# Patient Record
Sex: Male | Born: 1995 | Race: White | Hispanic: No | Marital: Single | Smoking: Current every day smoker
Health system: Southern US, Community
[De-identification: ages and names within clinical notes are randomized; demographics above are authoritative.]

## PROBLEM LIST (undated history)

## (undated) DIAGNOSIS — F419 Anxiety disorder, unspecified: Secondary | ICD-10-CM

## (undated) DIAGNOSIS — F410 Panic disorder [episodic paroxysmal anxiety] without agoraphobia: Secondary | ICD-10-CM

## (undated) HISTORY — DX: Anxiety disorder, unspecified: F41.9

## (undated) HISTORY — DX: Panic disorder (episodic paroxysmal anxiety): F41.0

---
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
## 2017-04-16 ENCOUNTER — Ambulatory Visit: Payer: Medicaid Other | Admitting: Family Medicine

## 2017-04-19 ENCOUNTER — Encounter: Payer: Self-pay | Admitting: Family Medicine

## 2017-08-31 ENCOUNTER — Emergency Department (HOSPITAL_COMMUNITY)
Admission: EM | Admit: 2017-08-31 | Discharge: 2017-08-31 | Disposition: A | Discharge status: Home / Self Care | Payer: Self-pay | Attending: Emergency Medicine | Admitting: Emergency Medicine

## 2017-08-31 ENCOUNTER — Other Ambulatory Visit: Payer: Self-pay

## 2017-08-31 ENCOUNTER — Encounter (HOSPITAL_COMMUNITY): Payer: Self-pay | Admitting: *Deleted

## 2017-08-31 DIAGNOSIS — Y9302 Activity, running: Secondary | ICD-10-CM | POA: Insufficient documentation

## 2017-08-31 DIAGNOSIS — Y998 Other external cause status: Secondary | ICD-10-CM | POA: Insufficient documentation

## 2017-08-31 DIAGNOSIS — Y929 Unspecified place or not applicable: Secondary | ICD-10-CM | POA: Insufficient documentation

## 2017-08-31 DIAGNOSIS — F17200 Nicotine dependence, unspecified, uncomplicated: Secondary | ICD-10-CM | POA: Insufficient documentation

## 2017-08-31 DIAGNOSIS — S39012A Strain of muscle, fascia and tendon of lower back, initial encounter: Secondary | ICD-10-CM | POA: Insufficient documentation

## 2017-08-31 DIAGNOSIS — Z79899 Other long term (current) drug therapy: Secondary | ICD-10-CM | POA: Insufficient documentation

## 2017-08-31 DIAGNOSIS — Y33XXXA Other specified events, undetermined intent, initial encounter: Secondary | ICD-10-CM | POA: Insufficient documentation

## 2017-08-31 MED ORDER — CYCLOBENZAPRINE HCL 10 MG PO TABS: 10.0000 mg | ORAL_TABLET | Freq: Three times a day (TID) | ORAL | 0 refills | Status: AC

## 2017-08-31 MED ORDER — IBUPROFEN 600 MG PO TABS: 600.0000 mg | ORAL_TABLET | Freq: Four times a day (QID) | ORAL | 0 refills | Status: DC

## 2017-08-31 NOTE — ED Triage Notes (Signed)
Pt c/o right lower back pain yesterday after running around playing with his dog.

## 2017-08-31 NOTE — Discharge Instructions (Signed)
Your vital signs are within normal limits.  Your examination favors muscle strain involving the lower back.  Please apply heating pad, or use warm Epson salt soaks daily.  Use 600 mg of ibuprofen with breakfast, lunch, dinner, and at bedtime.  Use Flexeril 3 times daily.  Flexeril may cause drowsiness.  Please do not drive, drink, operate machinery, or participate in activities requiring concentration when taking this medication.  Please see Dr. Romeo AppleHarrison for orthopedic management if not improving.

## 2017-08-31 NOTE — ED Provider Notes (Signed)
[facility] EMERGENCY DEPARTMENT Provider Note   CSN: [id number] Arrival date & time: 08/31/17  1458     History   Chief Complaint Chief Complaint  Patient presents with  . Back Pain    HPI Brett Salazar is a 21 y.o. male.  Patient is a 21 year old male who presents to the emergency department with a complaint of back pain.  The patient states that yesterday while running and chasing his dog he first felt a pull in his back.  He continued to play with his dog and later had a more sharp pain.  Last night and today he states it hurts when he tries to get out of the bed or sit down.  No loss of bowel or bladder function.  No loss of control of the lower extremity.  No previous history of back related issues.  The patient has not taken any medication for this up to this point.   The history is provided by the patient.  Back Pain   Pertinent negatives include no chest pain, no abdominal pain and no dysuria.    Past Medical History:  Diagnosis Date  . Anxiety   . Panic disorder     There are no active problems to display for this patient.   History reviewed. No pertinent surgical history.     Home Medications    Prior to Admission medications   Medication Sig Start Date End Date Taking? Authorizing Provider  clonazePAM (KLONOPIN) 0.5 MG tablet Take 1 tablet (0.5 mg total) by mouth 2 (two) times daily as needed for anxiety. 01/29/14   Floydene FlockNewton, Steven J, MD  FLUoxetine (PROZAC) 20 MG tablet Start with 1/2 tab daily for 1 week, then 1 tab daily thereafter 01/29/14   Floydene FlockNewton, Steven J, MD    Family History No family history on file.  Social History Social History   Tobacco Use  . Smoking status: Current Every Day Smoker    Packs/day: 1.00    Years: 3.00    Pack years: 3.00  . Smokeless tobacco: Never Used  Substance Use Topics  . Alcohol use: No  . Drug use: No     Allergies   Patient has no known allergies.   Review of Systems Review of Systems    Constitutional: Negative for activity change.       All ROS Neg except as noted in HPI  HENT: Negative for nosebleeds.   Eyes: Negative for photophobia and discharge.  Respiratory: Negative for cough, shortness of breath and wheezing.   Cardiovascular: Negative for chest pain and palpitations.  Gastrointestinal: Negative for abdominal pain and blood in stool.  Genitourinary: Negative for dysuria, frequency and hematuria.  Musculoskeletal: Positive for back pain. Negative for arthralgias and neck pain.  Skin: Negative.   Neurological: Negative for dizziness, seizures and speech difficulty.  Psychiatric/Behavioral: Negative for confusion and hallucinations.     Physical Exam Updated Vital Signs BP 119/84 (BP Location: Right Arm)   Pulse 99   Temp 98.5 F (36.9 C) (Oral)   Resp 18   Ht 5\' 8"  (1.727 m)   Wt 72.6 kg (160 lb)   SpO2 100%   BMI 24.33 kg/m   Physical Exam  Constitutional: He is oriented to person, place, and time. He appears well-developed and well-nourished.  Non-toxic appearance.  HENT:  Head: Normocephalic.  Right Ear: Tympanic membrane and external ear normal.  Left Ear: Tympanic membrane and external ear normal.  Eyes: EOM and lids are normal. Pupils are  equal, round, and reactive to light.  Neck: Normal range of motion. Neck supple. Carotid bruit is not present.  Cardiovascular: Normal rate, regular rhythm, normal heart sounds, intact distal pulses and normal pulses.  Pulmonary/Chest: Breath sounds normal. No respiratory distress.  Abdominal: Soft. Bowel sounds are normal. There is no tenderness. There is no guarding.  Musculoskeletal: Normal range of motion.       Lumbar back: He exhibits pain and spasm.       Back:  Lymphadenopathy:       Head (right side): No submandibular adenopathy present.       Head (left side): No submandibular adenopathy present.    He has no cervical adenopathy.  Neurological: He is alert and oriented to person, place, and  time. He has normal strength. No cranial nerve deficit or sensory deficit.  Skin: Skin is warm and dry.  Psychiatric: He has a normal mood and affect. His speech is normal.  Nursing note and vitals reviewed.    ED Treatments / Results  Labs (all labs ordered are listed, but only abnormal results are displayed) Labs Reviewed - No data to display  EKG  EKG Interpretation None       Radiology No results found.  Procedures Procedures (including critical care time)  Medications Ordered in ED Medications - No data to display   Initial Impression / Assessment and Plan / ED Course  I have reviewed the triage vital signs and the nursing notes.  Pertinent labs & imaging results that were available during my care of the patient were reviewed by me and considered in my medical decision making (see chart for details).       Final Clinical Impressions(s) / ED Diagnoses MDM Vital signs within normal limits.  There are no gross neurologic or vascular deficits appreciated.  No evidence for cauda equina or other emergent changes.  The patient is ambulatory without any foot drop.  Patient will use heating pad and rest the back.  He will also use ibuprofen every 6 hours and Flexeril 3 times daily.  The patient will follow up with orthopedics if not improving.   Final diagnoses:  Strain of lumbar region, initial encounter    ED Discharge Orders        Ordered    ibuprofen (ADVIL,MOTRIN) 600 MG tablet  4 times daily     08/31/17 1639    cyclobenzaprine (FLEXERIL) 10 MG tablet  3 times daily     08/31/17 1639       Ivery QualeBryant, Lizann Edelman, PA-C 08/31/17 1655    Long, Arlyss RepressJoshua G, MD 09/01/17 1017

## 2017-11-10 ENCOUNTER — Other Ambulatory Visit: Payer: Self-pay

## 2017-11-10 ENCOUNTER — Encounter (HOSPITAL_COMMUNITY): Payer: Self-pay

## 2017-11-10 ENCOUNTER — Emergency Department (HOSPITAL_COMMUNITY)
Admission: EM | Admit: 2017-11-10 | Discharge: 2017-11-11 | Disposition: A | Discharge status: Home / Self Care | Payer: Self-pay | Attending: Emergency Medicine | Admitting: Emergency Medicine

## 2017-11-10 ENCOUNTER — Emergency Department (HOSPITAL_COMMUNITY): Payer: Self-pay

## 2017-11-10 DIAGNOSIS — J40 Bronchitis, not specified as acute or chronic: Secondary | ICD-10-CM | POA: Insufficient documentation

## 2017-11-10 DIAGNOSIS — Z79899 Other long term (current) drug therapy: Secondary | ICD-10-CM | POA: Insufficient documentation

## 2017-11-10 DIAGNOSIS — F17200 Nicotine dependence, unspecified, uncomplicated: Secondary | ICD-10-CM | POA: Insufficient documentation

## 2017-11-10 DIAGNOSIS — M25512 Pain in left shoulder: Secondary | ICD-10-CM | POA: Insufficient documentation
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
## 2017-11-11 MED ORDER — LIDOCAINE 5 % EX PTCH: 1.0000 | MEDICATED_PATCH | CUTANEOUS | 0 refills | Status: AC

## 2017-11-11 MED ORDER — BENZONATATE 100 MG PO CAPS: 100.0000 mg | ORAL_CAPSULE | Freq: Three times a day (TID) | ORAL | 0 refills | Status: AC

## 2017-11-11 MED ORDER — PREDNISONE 10 MG PO TABS: 40.0000 mg | ORAL_TABLET | Freq: Every day | ORAL | 0 refills | Status: AC

## 2017-11-11 MED ORDER — PROMETHAZINE-DM 6.25-15 MG/5ML PO SYRP: 5.0000 mL | ORAL_SOLUTION | Freq: Four times a day (QID) | ORAL | 0 refills | Status: AC | PRN

## 2017-11-11 NOTE — ED Notes (Signed)
ED Provider at bedside. 
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
## 2017-11-11 NOTE — Discharge Instructions (Signed)
Take ibuprofen 3 times a day with meals.  Do not take other anti-inflammatories at the same time open (Advil, Motrin, naproxen, Aleve). You may supplement with Tylenol if you need further pain control. Use ice packs or heating pads if this helps control your pain. Take prednisone as prescribed. Use Tessalon Perles and cough syrup as needed. Make sure you stay well-hydrated with water. Wash your hands frequently to prevent spread of infection. Use the lidocaine patches as needed for pain. Follow-up with orthopedic doctor for further evaluation and management of your shoulder pain. Return to the emergency room if you develop numbness, worsening pain, difficulty breathing, persistent chest pain, persistent high fevers despite medication, or any new or worsening symptoms.

## 2017-12-02 ENCOUNTER — Encounter (HOSPITAL_BASED_OUTPATIENT_CLINIC_OR_DEPARTMENT_OTHER): Payer: Self-pay | Admitting: Emergency Medicine

## 2017-12-02 ENCOUNTER — Other Ambulatory Visit: Payer: Self-pay

## 2017-12-02 ENCOUNTER — Emergency Department (HOSPITAL_BASED_OUTPATIENT_CLINIC_OR_DEPARTMENT_OTHER)
Admission: EM | Admit: 2017-12-02 | Discharge: 2017-12-02 | Disposition: A | Discharge status: Home / Self Care | Payer: Self-pay | Attending: Emergency Medicine | Admitting: Emergency Medicine

## 2017-12-02 DIAGNOSIS — F17210 Nicotine dependence, cigarettes, uncomplicated: Secondary | ICD-10-CM | POA: Insufficient documentation

## 2017-12-02 DIAGNOSIS — R300 Dysuria: Secondary | ICD-10-CM | POA: Insufficient documentation

## 2017-12-02 DIAGNOSIS — R309 Painful micturition, unspecified: Secondary | ICD-10-CM | POA: Insufficient documentation

## 2017-12-02 DIAGNOSIS — Z202 Contact with and (suspected) exposure to infections with a predominantly sexual mode of transmission: Secondary | ICD-10-CM | POA: Insufficient documentation

## 2017-12-02 DIAGNOSIS — Z79899 Other long term (current) drug therapy: Secondary | ICD-10-CM | POA: Insufficient documentation

## 2017-12-02 LAB — URINALYSIS, ROUTINE W REFLEX MICROSCOPIC
Bilirubin Urine: NEGATIVE
Glucose, UA: NEGATIVE mg/dL
Hgb urine dipstick: NEGATIVE
KETONES UR: NEGATIVE mg/dL
NITRITE: NEGATIVE
PROTEIN: NEGATIVE mg/dL
Specific Gravity, Urine: 1.02 (ref 1.005–1.030)
pH: 7 (ref 5.0–8.0)

## 2017-12-02 LAB — URINALYSIS, MICROSCOPIC (REFLEX)

## 2017-12-02 MED ORDER — CEFTRIAXONE SODIUM 250 MG IJ SOLR
250.0000 mg | Freq: Once | INTRAMUSCULAR | Status: AC
  Administered 2017-12-02: 250 mg via INTRAMUSCULAR
  Filled 2017-12-02: qty 250

## 2017-12-02 MED ORDER — AZITHROMYCIN 250 MG PO TABS
1000.0000 mg | ORAL_TABLET | Freq: Once | ORAL | Status: AC
  Administered 2017-12-02: 1000 mg via ORAL
  Filled 2017-12-02: qty 4

## 2017-12-02 MED ORDER — LIDOCAINE HCL (PF) 1 % IJ SOLN
INTRAMUSCULAR | Status: AC
  Administered 2017-12-02: 0.9 mL
  Filled 2017-12-02: qty 5

## 2017-12-02 NOTE — ED Provider Notes (Signed)
[facility] EMERGENCY DEPARTMENT Provider Note   CSN: [id number] Arrival date & time: 12/02/17  1414     History   Chief Complaint Chief Complaint  Patient presents with  . Exposure to STD    HPI Brett Salazar is a 22 y.o. male.  HPI   Brett Salazar is a 22yo male with a history of anxiety who presents to the emergency department for evaluation of penile discharge.  He reports that he has one male sexual partner who was recently diagnosed with chlamydia.  He does not regularly wear condoms with this partner.  States he noticed white discharge expressed from his penis about a week ago.  Endorses mild burning sensation with urination.  Denies fever, chills, abdominal pain, nausea/vomiting, urinary frequency, testicular pain/swelling, penile pain, painful bowel movements, lesion or rash on the genitalia.   Past Medical History:  Diagnosis Date  . Anxiety   . Panic disorder     There are no active problems to display for this patient.   History reviewed. No pertinent surgical history.     Home Medications    Prior to Admission medications   Medication Sig Start Date End Date Taking? Authorizing Provider  benzonatate (TESSALON) 100 MG capsule Take 1 capsule (100 mg total) by mouth every 8 (eight) hours. 11/11/17   Caccavale, Sophia, PA-C  clonazePAM (KLONOPIN) 0.5 MG tablet Take 1 tablet (0.5 mg total) by mouth 2 (two) times daily as needed for anxiety. 01/29/14   Floydene Flock, MD  cyclobenzaprine (FLEXERIL) 10 MG tablet Take 1 tablet (10 mg total) by mouth 3 (three) times daily. 08/31/17   Ivery Quale, PA-C  FLUoxetine (PROZAC) 20 MG tablet Start with 1/2 tab daily for 1 week, then 1 tab daily thereafter 01/29/14   Floydene Flock, MD  ibuprofen (ADVIL,MOTRIN) 600 MG tablet Take 1 tablet (600 mg total) by mouth 4 (four) times daily. 08/31/17   Ivery Quale, PA-C  lidocaine (LIDODERM) 5 % Place 1 patch onto the skin daily. Remove & Discard patch within 12 hours  or as directed by MD 11/11/17   Caccavale, Sophia, PA-C  promethazine-dextromethorphan (PROMETHAZINE-DM) 6.25-15 MG/5ML syrup Take 5 mLs by mouth 4 (four) times daily as needed for cough. 11/11/17   Caccavale, Sophia, PA-C    Family History History reviewed. No pertinent family history.  Social History Social History   Tobacco Use  . Smoking status: Current Every Day Smoker    Packs/day: 1.00    Years: 3.00    Pack years: 3.00  . Smokeless tobacco: Never Used  Substance Use Topics  . Alcohol use: No  . Drug use: No     Allergies   Patient has no known allergies.   Review of Systems Review of Systems  Constitutional: Negative for chills and fever.  Gastrointestinal: Negative for abdominal pain, nausea and vomiting.  Genitourinary: Positive for discharge and dysuria. Negative for difficulty urinating, frequency, hematuria, scrotal swelling and testicular pain.  Skin: Negative for rash.     Physical Exam Updated Vital Signs BP 111/88 (BP Location: Left Arm)   Pulse 75   Temp 98.5 F (36.9 C) (Oral)   Resp 18   Ht 5\' 8"  (1.727 m)   Wt 72.6 kg (160 lb)   SpO2 100%   BMI 24.33 kg/m   Physical Exam  Constitutional: He is oriented to person, place, and time. He appears well-developed and well-nourished. No distress.  HENT:  Head: Normocephalic and atraumatic.  Eyes: Right eye exhibits  no discharge. Left eye exhibits no discharge.  Pulmonary/Chest: Effort normal. No respiratory distress.  Abdominal: Soft. Bowel sounds are normal. There is no tenderness. There is no guarding.  Genitourinary:  Genitourinary Comments: Chaperone present for exam. No discharge from penis. No signs of lesion or erythema on the penis or testicles. The penis and testicles are nontender. No testicular masses or swelling.   Musculoskeletal: Normal range of motion.  Neurological: He is alert and oriented to person, place, and time. Coordination normal.  Skin: Skin is warm and dry. Capillary  refill takes less than 2 seconds. He is not diaphoretic.  Psychiatric: He has a normal mood and affect. His behavior is normal.  Nursing note and vitals reviewed.    ED Treatments / Results  Labs (all labs ordered are listed, but only abnormal results are displayed) Labs Reviewed  URINALYSIS, ROUTINE W REFLEX MICROSCOPIC - Abnormal; Notable for the following components:      Result Value   Leukocytes, UA TRACE (*)    All other components within normal limits  URINALYSIS, MICROSCOPIC (REFLEX) - Abnormal; Notable for the following components:   Bacteria, UA RARE (*)    Squamous Epithelial / LPF 0-5 (*)    All other components within normal limits  RPR  HIV ANTIBODY (ROUTINE TESTING)  GC/CHLAMYDIA PROBE AMP ([place]) NOT AT [facility]    EKG  EKG Interpretation None       Radiology No results found.  Procedures Procedures (including critical care time)  Medications Ordered in ED Medications  azithromycin (ZITHROMAX) tablet 1,000 mg (1,000 mg Oral Given 12/02/17 1627)  cefTRIAXone (ROCEPHIN) injection 250 mg (250 mg Intramuscular Given 12/02/17 1627)  lidocaine (PF) (XYLOCAINE) 1 % injection (0.9 mLs  Given 12/02/17 1627)     Initial Impression / Assessment and Plan / ED Course  I have reviewed the triage vital signs and the nursing notes.  Pertinent labs & imaging results that were available during my care of the patient were reviewed by me and considered in my medical decision making (see chart for details).    Patient is afebrile without abdominal tenderness, abdominal pain or painful bowel movements to indicate prostatitis.  No tenderness to palpation of the testes or epididymis to suggest orchitis or epididymitis. STD cultures obtained including HIV, syphilis, gonorrhea and chlamydia. Patient to be discharged with instructions to follow up with PCP. Discussed importance of using protection when sexually active. Pt understands that he has GC/Chlamydia cultures pending and  will need to inform all sexual partners if results return positive. Patient has been treated prophylactically with azithromycin and Rocephin.  Final Clinical Impressions(s) / ED Diagnoses   Final diagnoses:  STD exposure    ED Discharge Orders    None       Lawrence MarseillesShrosbree, Daphna Lafuente J, PA-C 12/02/17 1640    Alvira MondaySchlossman, Erin, MD 12/02/17 2324

## 2017-12-02 NOTE — ED Triage Notes (Signed)
Patient states that he may have been exposed to an STD - possible chlamydia. The patient reports that he is having a milky d/c

## 2017-12-02 NOTE — Discharge Instructions (Signed)
You were treated for chlamydia and area in the emergency department.  Please refrain from sexual activity for the next 7 days.  You have HIV, syphilis, gonorrhea and Chlamydia testing which is been sent to the lab.  He will receive a phone call in the next several days if your results are positive.  If positive you will need to inform all sexual partners.  I have listed the information below to the health department should you have future STD concerns.  Return to the emergency department if you develop fever greater than 100.4 F, testicular pain or have any new or worsening symptoms.

## 2017-12-03 LAB — HIV ANTIBODY (ROUTINE TESTING W REFLEX): HIV Screen 4th Generation wRfx: NONREACTIVE

## 2017-12-03 LAB — GC/CHLAMYDIA PROBE AMP (~~LOC~~) NOT AT ARMC
Chlamydia: POSITIVE — AB
Neisseria Gonorrhea: NEGATIVE

## 2017-12-03 LAB — RPR: RPR Ser Ql: NONREACTIVE
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
## 2019-03-17 ENCOUNTER — Encounter (HOSPITAL_COMMUNITY): Payer: Self-pay | Admitting: Emergency Medicine

## 2019-03-17 ENCOUNTER — Emergency Department (HOSPITAL_COMMUNITY)
Admission: EM | Admit: 2019-03-17 | Discharge: 2019-03-17 | Disposition: A | Discharge status: Home / Self Care | Payer: Self-pay | Attending: Emergency Medicine | Admitting: Emergency Medicine

## 2019-03-17 ENCOUNTER — Other Ambulatory Visit: Payer: Self-pay

## 2019-03-17 DIAGNOSIS — Y9389 Activity, other specified: Secondary | ICD-10-CM | POA: Insufficient documentation

## 2019-03-17 DIAGNOSIS — F17210 Nicotine dependence, cigarettes, uncomplicated: Secondary | ICD-10-CM | POA: Insufficient documentation

## 2019-03-17 DIAGNOSIS — Y929 Unspecified place or not applicable: Secondary | ICD-10-CM | POA: Insufficient documentation

## 2019-03-17 DIAGNOSIS — S80862A Insect bite (nonvenomous), left lower leg, initial encounter: Secondary | ICD-10-CM | POA: Insufficient documentation

## 2019-03-17 DIAGNOSIS — S80861A Insect bite (nonvenomous), right lower leg, initial encounter: Secondary | ICD-10-CM | POA: Insufficient documentation

## 2019-03-17 DIAGNOSIS — Z79899 Other long term (current) drug therapy: Secondary | ICD-10-CM | POA: Insufficient documentation

## 2019-03-17 DIAGNOSIS — L539 Erythematous condition, unspecified: Secondary | ICD-10-CM | POA: Insufficient documentation

## 2019-03-17 DIAGNOSIS — Y998 Other external cause status: Secondary | ICD-10-CM | POA: Insufficient documentation

## 2019-03-17 DIAGNOSIS — W57XXXA Bitten or stung by nonvenomous insect and other nonvenomous arthropods, initial encounter: Secondary | ICD-10-CM | POA: Insufficient documentation

## 2019-03-17 MED ORDER — DOXYCYCLINE HYCLATE 100 MG PO CAPS: 100.0000 mg | ORAL_CAPSULE | Freq: Two times a day (BID) | ORAL | 0 refills | Status: DC

## 2019-03-17 NOTE — ED Provider Notes (Signed)
[facility]ERGENCY DEPARTMENT Provider Note   CSN: [id number] Arrival date & time: 03/17/19  2155    History   Chief Complaint Chief Complaint  Patient presents with  . Insect Bite    tick bite    HPI Laury DeepJeffery D Brimley is a 23 y.o. male.     Patient is a 23 year old male presenting with multiple tick bites to both legs.  He states he is removed several ticks from each leg and has welts and red areas there.  He states he has not felt well, but denies fever or headache.  He denies other complaints.  The history is provided by the patient.    Past Medical History:  Diagnosis Date  . Anxiety   . Panic disorder     There are no active problems to display for this patient.   No past surgical history on file.      Home Medications    Prior to Admission medications   Medication Sig Start Date End Date Taking? Authorizing Provider  benzonatate (TESSALON) 100 MG capsule Take 1 capsule (100 mg total) by mouth every 8 (eight) hours. 11/11/17   Caccavale, Sophia, PA-C  clonazePAM (KLONOPIN) 0.5 MG tablet Take 1 tablet (0.5 mg total) by mouth 2 (two) times daily as needed for anxiety. 01/29/14   Floydene FlockNewton, Steven J, MD  cyclobenzaprine (FLEXERIL) 10 MG tablet Take 1 tablet (10 mg total) by mouth 3 (three) times daily. 08/31/17   Ivery QualeBryant, Hobson, PA-C  FLUoxetine (PROZAC) 20 MG tablet Start with 1/2 tab daily for 1 week, then 1 tab daily thereafter 01/29/14   Floydene FlockNewton, Steven J, MD  ibuprofen (ADVIL,MOTRIN) 600 MG tablet Take 1 tablet (600 mg total) by mouth 4 (four) times daily. 08/31/17   Ivery QualeBryant, Hobson, PA-C  lidocaine (LIDODERM) 5 % Place 1 patch onto the skin daily. Remove & Discard patch within 12 hours or as directed by MD 11/11/17   Caccavale, Sophia, PA-C  promethazine-dextromethorphan (PROMETHAZINE-DM) 6.25-15 MG/5ML syrup Take 5 mLs by mouth 4 (four) times daily as needed for cough. 11/11/17   Caccavale, Sophia, PA-C    Family History History reviewed. No pertinent family  history.  Social History Social History   Tobacco Use  . Smoking status: Current Every Day Smoker    Packs/day: 1.00    Years: 3.00    Pack years: 3.00  . Smokeless tobacco: Never Used  Substance Use Topics  . Alcohol use: No  . Drug use: No     Allergies   Patient has no known allergies.   Review of Systems Review of Systems  All other systems reviewed and are negative.    Physical Exam Updated Vital Signs BP 127/78 (BP Location: Right Arm)   Pulse 88   Temp 97.8 F (36.6 C) (Oral)   Resp 18   Ht 5\' 7"  (1.702 m)   Wt 72.6 kg   SpO2 98%   BMI 25.06 kg/m   Physical Exam Vitals signs and nursing note reviewed.  Constitutional:      General: He is not in acute distress.    Appearance: He is well-developed. He is not diaphoretic.  HENT:     Head: Normocephalic and atraumatic.  Neck:     Musculoskeletal: Normal range of motion and neck supple.  Cardiovascular:     Rate and Rhythm: Normal rate and regular rhythm.     Heart sounds: No murmur. No friction rub.  Pulmonary:     Effort: Pulmonary effort is normal. No respiratory  distress.     Breath sounds: Normal breath sounds. No wheezing or rales.  Abdominal:     General: Bowel sounds are normal. There is no distension.     Palpations: Abdomen is soft.     Tenderness: There is no abdominal tenderness.  Musculoskeletal: Normal range of motion.  Skin:    General: Skin is warm and dry.     Comments: There are multiple punctate lesions with mild surrounding erythema to both thighs.  There are no target lesions.  Neurological:     Mental Status: He is alert and oriented to person, place, and time.     Coordination: Coordination normal.      ED Treatments / Results  Labs (all labs ordered are listed, but only abnormal results are displayed) Labs Reviewed - No data to display  EKG None  Radiology No results found.  Procedures Procedures (including critical care time)  Medications Ordered in ED  Medications - No data to display   Initial Impression / Assessment and Plan / ED Course  I have reviewed the triage vital signs and the nursing notes.  Pertinent labs & imaging results that were available during my care of the patient were reviewed by me and considered in my medical decision making (see chart for details).  Patient will be treated with doxycycline and is to follow-up as needed.  Final Clinical Impressions(s) / ED Diagnoses   Final diagnoses:  None    ED Discharge Orders    None       Geoffery Lyons, MD 03/17/19 2234

## 2019-03-17 NOTE — ED Triage Notes (Signed)
Patient states that he has multiple tick bites on bilateral thighs. Patient states that he noticed the bites 5 days ago and had removed several ticks.

## 2019-03-17 NOTE — Discharge Instructions (Signed)
Begin taking doxycycline as prescribed this evening.  Return to the emergency department if you develop severe headache, high fever, or other new and concerning symptoms.

## 2019-03-25 ENCOUNTER — Inpatient Hospital Stay
Admission: RE | Admit: 2019-03-25 | Discharge: 2019-03-25 | Disposition: A | Discharge status: Home / Self Care | Payer: Self-pay | Source: Ambulatory Visit

## 2022-01-23 ENCOUNTER — Other Ambulatory Visit: Payer: Self-pay

## 2022-01-23 ENCOUNTER — Encounter (HOSPITAL_COMMUNITY): Payer: Self-pay | Admitting: Emergency Medicine

## 2022-01-23 ENCOUNTER — Emergency Department (HOSPITAL_COMMUNITY)
Admission: EM | Admit: 2022-01-23 | Discharge: 2022-01-23 | Disposition: A | Discharge status: Home / Self Care | Payer: Self-pay | Attending: Emergency Medicine | Admitting: Emergency Medicine

## 2022-01-23 DIAGNOSIS — S025XXA Fracture of tooth (traumatic), initial encounter for closed fracture: Secondary | ICD-10-CM | POA: Insufficient documentation

## 2022-01-23 DIAGNOSIS — X58XXXA Exposure to other specified factors, initial encounter: Secondary | ICD-10-CM | POA: Insufficient documentation

## 2022-01-23 DIAGNOSIS — R220 Localized swelling, mass and lump, head: Secondary | ICD-10-CM

## 2022-01-23 LAB — CBC
HCT: 49.4 % (ref 39.0–52.0)
Hemoglobin: 16.9 g/dL (ref 13.0–17.0)
MCH: 32.1 pg (ref 26.0–34.0)
MCHC: 34.2 g/dL (ref 30.0–36.0)
MCV: 93.9 fL (ref 80.0–100.0)
Platelets: 229 10*3/uL (ref 150–400)
RBC: 5.26 MIL/uL (ref 4.22–5.81)
RDW: 12 % (ref 11.5–15.5)
WBC: 11.4 10*3/uL — ABNORMAL HIGH (ref 4.0–10.5)
nRBC: 0 % (ref 0.0–0.2)

## 2022-01-23 LAB — BASIC METABOLIC PANEL
Anion gap: 7 (ref 5–15)
BUN: 12 mg/dL (ref 6–20)
CO2: 26 mmol/L (ref 22–32)
Calcium: 8.8 mg/dL — ABNORMAL LOW (ref 8.9–10.3)
Chloride: 104 mmol/L (ref 98–111)
Creatinine, Ser: 0.95 mg/dL (ref 0.61–1.24)
GFR, Estimated: >60 mL/min (ref >60)
Glucose, Bld: 107 mg/dL — ABNORMAL HIGH (ref 70–99)
Potassium: 3.9 mmol/L (ref 3.5–5.1)
Sodium: 137 mmol/L (ref 135–145)

## 2022-01-23 MED ORDER — AMOXICILLIN-POT CLAVULANATE 875-125 MG PO TABS: 1.0000 | ORAL_TABLET | Freq: Two times a day (BID) | ORAL | 0 refills | Status: AC

## 2022-01-23 MED ORDER — IBUPROFEN 800 MG PO TABS
800.0000 mg | ORAL_TABLET | Freq: Once | ORAL | Status: AC
  Administered 2022-01-23: 800 mg via ORAL
  Filled 2022-01-23: qty 1

## 2022-01-23 MED ORDER — ACETAMINOPHEN 325 MG PO TABS: 650.0000 mg | ORAL_TABLET | Freq: Once | ORAL | Status: DC

## 2022-01-23 MED ORDER — AMOXICILLIN-POT CLAVULANATE 875-125 MG PO TABS
1.0000 | ORAL_TABLET | Freq: Once | ORAL | Status: AC
  Administered 2022-01-23: 1 via ORAL
  Filled 2022-01-23: qty 1

## 2022-01-23 NOTE — ED Provider Notes (Signed)
?[facility] ?Provider Note ? ? ?CSN: [id number] ?Arrival date & time: 01/23/22  1216 ? ?  ? ?History ?Chief Complaint  ?Patient presents with  ? Facial Swelling  ? ?Brett Salazar is a 26 y.o. male who presents the emergency department with 1 day history of facial swelling and pain.  Patient does have a history of cavities.  He does have a dentist and follows with the health department.  No fever or chills.  Patient does have trouble opening the mouth secondary to pain and swelling.  Able to tolerate own secretions. ? ?HPI ? ?  ? ?Home Medications ?Prior to Admission medications   ?Medication Sig Start Date End Date Taking? Authorizing Provider  ?amoxicillin-clavulanate (AUGMENTIN) 875-125 MG tablet Take 1 tablet by mouth every 12 (twelve) hours. 01/23/22  Yes Brett Salazar, Brett Salazar M, PA-C  ?benzonatate (TESSALON) 100 MG capsule Take 1 capsule (100 mg total) by mouth every 8 (eight) hours. 11/11/17   Salazar, Sophia, PA-C  ?clonazePAM (KLONOPIN) 0.5 MG tablet Take 1 tablet (0.5 mg total) by mouth 2 (two) times daily as needed for anxiety. 01/29/14   Brett Lever, MD  ?cyclobenzaprine (FLEXERIL) 10 MG tablet Take 1 tablet (10 mg total) by mouth 3 (three) times daily. 08/31/17   Brett Kocher, PA-C  ?FLUoxetine (PROZAC) 20 MG tablet Start with 1/2 tab daily for 1 week, then 1 tab daily thereafter 01/29/14   Brett Lever, MD  ?lidocaine (LIDODERM) 5 % Place 1 patch onto the skin daily. Remove & Discard patch within 12 hours or as directed by MD 11/11/17   Salazar, Sophia, PA-C  ?promethazine-dextromethorphan (PROMETHAZINE-DM) 6.25-15 MG/5ML syrup Take 5 mLs by mouth 4 (four) times daily as needed for cough. 11/11/17   Salazar, Sophia, PA-C  ?   ? ?Allergies    ?Patient has no known allergies.   ? ?Review of Systems   ?Review of Systems  ?All other systems reviewed and are negative. ? ?Physical Exam ?Updated Vital Signs ?BP (!) 128/91 (BP Location: Right Arm)   Pulse 74   Temp 98.4 ?F (36.9 ?C)  (Oral)   Resp 16   Ht 5\' 7"  (1.702 m)   Wt 77.1 kg   SpO2 99%   BMI 26.63 kg/m?  ?Physical Exam ?Vitals and nursing note reviewed.  ?Constitutional:   ?   Appearance: Normal appearance.  ?HENT:  ?   Head: Normocephalic and atraumatic.  ?   Comments: There is mild swelling to the right maxillofacial area.  Area is nontender to palpation. ?   Mouth/Throat:  ? ?   Comments: No swelling of the floor of the oral palate.  Tongue is normal.  No pharyngeal erythema.  Talking in complete sentences.  Tonsils are normal. ?Eyes:  ?   General:     ?   Right eye: No discharge.     ?   Left eye: No discharge.  ?   Conjunctiva/sclera: Conjunctivae normal.  ?Neck:  ?   Comments: There is right-sided cervical lymphadenopathy. ?Pulmonary:  ?   Effort: Pulmonary effort is normal.  ?Skin: ?   General: Skin is warm and dry.  ?   Findings: No rash.  ?Neurological:  ?   General: No focal deficit present.  ?   Mental Status: He is alert.  ?Psychiatric:     ?   Mood and Affect: Mood normal.     ?   Behavior: Behavior normal.  ? ? ?ED Results / Procedures / Treatments   ?  Labs ?(all labs ordered are listed, but only abnormal results are displayed) ?Labs Reviewed  ?BASIC METABOLIC PANEL - Abnormal; Notable for the following components:  ?    Result Value  ? Glucose, Bld 107 (*)   ? Calcium 8.8 (*)   ? All other components within normal limits  ?CBC - Abnormal; Notable for the following components:  ? WBC 11.4 (*)   ? All other components within normal limits  ? ? ?EKG ?None ? ?Radiology ?No results found. ? ?Procedures ?Procedures  ? ? ?Medications Ordered in ED ?Medications  ?amoxicillin-clavulanate (AUGMENTIN) 875-125 MG per tablet 1 tablet (1 tablet Oral Given 01/23/22 1450)  ?ibuprofen (ADVIL) tablet 800 mg (800 mg Oral Given 01/23/22 1450)  ? ? ?ED Course/ Medical Decision Making/ A&P ?  ?                        ?Medical Decision Making ?Amount and/or Complexity of Data Reviewed ?Labs: ordered. ? ?Risk ?Prescription drug  management. ? ? ?Brett Salazar is a 26 y.o. male who presents to the emergency department today with right-sided facial swelling and pain.  This is likely secondary to 1 or multiple dental caries that the patient has in the right side of the mouth.  These primarily reside in the upper and lower molars.  Patient is followed with dentistry.  I will write him a prescription for Augmentin and Motrin to go home with.  Patient amenable this plan.  I also instructed him to perform cold therapy.  He was instructed to return to the emergency department for any worsening symptoms including fever, trouble breathing, trouble swallowing, or any other concerns.  He is safe for discharge.  Augmentin and Motrin given in the department as well. ? ? ?Final Clinical Impression(s) / ED Diagnoses ?Final diagnoses:  ?Facial swelling  ?Closed fracture of tooth, initial encounter  ? ? ?Rx / DC Orders ?ED Discharge Orders   ? ?      Ordered  ?  amoxicillin-clavulanate (AUGMENTIN) 875-125 MG tablet  Every 12 hours       ? 01/23/22 1451  ? ?  ?  ? ?  ? ? ?  ?Hendricks Limes, PA-C ?01/23/22 1455 ? ?  ?Fredia Sorrow, MD ?01/26/22 2338 ? ?

## 2022-01-23 NOTE — Discharge Instructions (Signed)
Please take antibiotics and pain medication as prescribed.  Like for you to call the health department to schedule an appointment with dentistry sometime early next week.  Return to the emergency department for any worsening symptoms you might have. ?

## 2022-01-23 NOTE — ED Notes (Signed)
Not in waiting area.  

## 2022-01-23 NOTE — ED Triage Notes (Signed)
Pt to the ED with complaints of right side facial swelling that began yesterday. ? ?Pt denies dental pain. ? ?

## 2024-02-17 ENCOUNTER — Emergency Department (HOSPITAL_COMMUNITY)
Admission: EM | Admit: 2024-02-17 | Discharge: 2024-02-17 | Disposition: A | Discharge status: Home / Self Care | Attending: Emergency Medicine | Admitting: Emergency Medicine

## 2024-02-17 ENCOUNTER — Emergency Department (HOSPITAL_COMMUNITY)

## 2024-02-17 ENCOUNTER — Other Ambulatory Visit: Payer: Self-pay

## 2024-02-17 ENCOUNTER — Encounter (HOSPITAL_COMMUNITY): Payer: Self-pay

## 2024-02-17 DIAGNOSIS — B2790 Infectious mononucleosis, unspecified without complication: Secondary | ICD-10-CM

## 2024-02-17 DIAGNOSIS — B9689 Other specified bacterial agents as the cause of diseases classified elsewhere: Secondary | ICD-10-CM | POA: Insufficient documentation

## 2024-02-17 DIAGNOSIS — D72829 Elevated white blood cell count, unspecified: Secondary | ICD-10-CM | POA: Diagnosis not present

## 2024-02-17 DIAGNOSIS — J038 Acute tonsillitis due to other specified organisms: Secondary | ICD-10-CM | POA: Diagnosis not present

## 2024-02-17 DIAGNOSIS — J029 Acute pharyngitis, unspecified: Secondary | ICD-10-CM | POA: Diagnosis present

## 2024-02-17 LAB — CBC WITH DIFFERENTIAL/PLATELET
Abs Immature Granulocytes: 0 10*3/uL (ref 0.00–0.07)
Basophils Absolute: 0.1 10*3/uL (ref 0.0–0.1)
Basophils Relative: 1 %
Eosinophils Absolute: 0.1 10*3/uL (ref 0.0–0.5)
Eosinophils Relative: 1 %
HCT: 46.8 % (ref 39.0–52.0)
Hemoglobin: 16 g/dL (ref 13.0–17.0)
Lymphocytes Relative: 58 %
Lymphs Abs: 7.9 10*3/uL — ABNORMAL HIGH (ref 0.7–4.0)
MCH: 32.5 pg (ref 26.0–34.0)
MCHC: 34.2 g/dL (ref 30.0–36.0)
MCV: 95.1 fL (ref 80.0–100.0)
Monocytes Absolute: 1.1 10*3/uL — ABNORMAL HIGH (ref 0.1–1.0)
Monocytes Relative: 8 %
Neutro Abs: 4.4 10*3/uL (ref 1.7–7.7)
Neutrophils Relative %: 32 %
Platelets: 223 10*3/uL (ref 150–400)
RBC: 4.92 MIL/uL (ref 4.22–5.81)
RDW: 12.2 % (ref 11.5–15.5)
Smear Review: ADEQUATE
WBC Morphology: >10
WBC: 13.6 10*3/uL — ABNORMAL HIGH (ref 4.0–10.5)
nRBC: 0 % (ref 0.0–0.2)

## 2024-02-17 LAB — BASIC METABOLIC PANEL WITH GFR
Anion gap: 8 (ref 5–15)
BUN: 10 mg/dL (ref 6–20)
CO2: 28 mmol/L (ref 22–32)
Calcium: 9.1 mg/dL (ref 8.9–10.3)
Chloride: 102 mmol/L (ref 98–111)
Creatinine, Ser: 1.02 mg/dL (ref 0.61–1.24)
GFR, Estimated: >60 mL/min (ref >60)
Glucose, Bld: 110 mg/dL — ABNORMAL HIGH (ref 70–99)
Potassium: 4 mmol/L (ref 3.5–5.1)
Sodium: 138 mmol/L (ref 135–145)

## 2024-02-17 LAB — MONONUCLEOSIS SCREEN: Mono Screen: POSITIVE — AB

## 2024-02-17 MED ORDER — METHYLPREDNISOLONE 4 MG PO TBPK: ORAL_TABLET | ORAL | 0 refills | Status: AC

## 2024-02-17 MED ORDER — CLINDAMYCIN PHOSPHATE 300 MG/50ML IV SOLN
300.0000 mg | Freq: Once | INTRAVENOUS | Status: AC
  Administered 2024-02-17: 300 mg via INTRAVENOUS
  Filled 2024-02-17: qty 50

## 2024-02-17 MED ORDER — IOHEXOL 300 MG/ML  SOLN
75.0000 mL | Freq: Once | INTRAMUSCULAR | Status: AC | PRN
  Administered 2024-02-17: 75 mL via INTRAVENOUS

## 2024-02-17 MED ORDER — LIDOCAINE VISCOUS HCL 2 % MT SOLN: 15.0000 mL | OROMUCOSAL | 0 refills | Status: AC | PRN

## 2024-02-17 MED ORDER — NAPROXEN 500 MG PO TABS: 500.0000 mg | ORAL_TABLET | Freq: Two times a day (BID) | ORAL | 0 refills | Status: AC

## 2024-02-17 MED ORDER — DEXAMETHASONE SODIUM PHOSPHATE 10 MG/ML IJ SOLN
10.0000 mg | Freq: Once | INTRAMUSCULAR | Status: AC
  Administered 2024-02-17: 10 mg via INTRAVENOUS
  Filled 2024-02-17: qty 1

## 2024-02-17 NOTE — ED Triage Notes (Signed)
 Pt reports he was positive for strep 1 week ago and has been taking amoxicillin  but has not improved at all.

## 2024-02-17 NOTE — Discharge Instructions (Signed)
 Your testing shows that you have mononucleosis which is a virus infection that can cause swelling and pain and pus on your tonsils, and the rest of your tests were reassuring, the CT scan did not show any signs of abscess, it does show that your tonsils are very swollen.  For this reason I have given you a steroid which should help with the swelling and pain.  Please take the following medications when you are at home to help  Medrol, taper over the next 6 days  Liquid lidocaine  can be taken every 4 hours, gargle and swallow to help with the pain  Please take Naprosyn, 500mg  by mouth twice daily as needed for pain - this in an antiinflammatory medicine (NSAID) and is similar to ibuprofen  - many people feel that it is stronger than ibuprofen  and it is easier to take since it is a smaller pill.  Please use this only for 1 week - if your pain persists, you will need to follow up with your doctor in the office for ongoing guidance and pain control.  Thank you for allowing us  to treat you in the emergency department today.  After reviewing your examination and potential testing that was done it appears that you are safe to go home.  I would like for you to follow-up with your doctor within the next several days, have them obtain your records and follow-up with them to review all potential tests and results from your visit.  If you should develop severe or worsening symptoms return to the emergency department immediately

## 2024-02-17 NOTE — ED Provider Notes (Signed)
 [place] EMERGENCY DEPARTMENT AT [facility] Provider Note   CSN: [id number] Arrival date & time: 02/17/24  1928     History  Chief Complaint  Patient presents with   Sore Throat    Brett Salazar is a 28 y.o. male.   Sore Throat   This patient is a 28 year old male who presents to the hospital with a complaint of a sore throat, he has been treated for strep throat after an urgent care visit on April 30 when he tested positive for strep on a rapid strep test.  He has been on amoxicillin  twice a day for 8 days but feels like over the last 3 days that his pain is getting worse and he is now spitting up some blood clots.  He is not short of breath febrile nauseated or vomiting and has no abdominal pain.    Home Medications Prior to Admission medications   Medication Sig Start Date End Date Taking? Authorizing Provider  lidocaine  (XYLOCAINE ) 2 % solution Use as directed 15 mLs in the mouth or throat every 4 (four) hours as needed for mouth pain. 02/17/24  Yes Early Glisson, MD  methylPREDNISolone (MEDROL DOSEPAK) 4 MG TBPK tablet Taper over 6 days 02/17/24  Yes Early Glisson, MD  naproxen (NAPROSYN) 500 MG tablet Take 1 tablet (500 mg total) by mouth 2 (two) times daily with a meal. 02/17/24  Yes Early Glisson, MD  amoxicillin -clavulanate (AUGMENTIN ) 875-125 MG tablet Take 1 tablet by mouth every 12 (twelve) hours. 01/23/22   Angelyn Kennel M, PA-C  benzonatate  (TESSALON ) 100 MG capsule Take 1 capsule (100 mg total) by mouth every 8 (eight) hours. 11/11/17   Caccavale, Sophia, PA-C  clonazePAM  (KLONOPIN ) 0.5 MG tablet Take 1 tablet (0.5 mg total) by mouth 2 (two) times daily as needed for anxiety. 01/29/14   Corrinne Din, MD  cyclobenzaprine  (FLEXERIL ) 10 MG tablet Take 1 tablet (10 mg total) by mouth 3 (three) times daily. 08/31/17   Venson Ginger, PA-C  FLUoxetine  (PROZAC ) 20 MG tablet Start with 1/2 tab daily for 1 week, then 1 tab daily thereafter 01/29/14   Corrinne Din,  MD  lidocaine  (LIDODERM ) 5 % Place 1 patch onto the skin daily. Remove & Discard patch within 12 hours or as directed by MD 11/11/17   Caccavale, Sophia, PA-C  promethazine -dextromethorphan (PROMETHAZINE -DM) 6.25-15 MG/5ML syrup Take 5 mLs by mouth 4 (four) times daily as needed for cough. 11/11/17   Caccavale, Sophia, PA-C      Allergies    Patient has no known allergies.    Review of Systems   Review of Systems  All other systems reviewed and are negative.   Physical Exam Updated Vital Signs BP (!) 124/92   Pulse 94   Temp 98.3 F (36.8 C)   Resp 18   Ht 1.702 m (5\' 7" )   Wt 81.6 kg   SpO2 100%   BMI 28.19 kg/m  Physical Exam Vitals and nursing note reviewed.  Constitutional:      General: He is not in acute distress.    Appearance: He is well-developed.  HENT:     Head: Normocephalic and atraumatic.     Nose: Nose normal.     Mouth/Throat:     Mouth: Mucous membranes are moist.     Pharynx: Oropharyngeal exudate and posterior oropharyngeal erythema present.     Comments: Posterior pharynx with bilateral tonsillar hypertrophy, bilateral tonsillar exudate, no asymmetry, voice is slightly froggy Eyes:  General: No scleral icterus.       Right eye: No discharge.        Left eye: No discharge.     Conjunctiva/sclera: Conjunctivae normal.     Pupils: Pupils are equal, round, and reactive to light.  Neck:     Thyroid: No thyromegaly.     Vascular: No JVD.     Comments: Supple neck but tender bilateral anterior cervical lymphadenopathy Cardiovascular:     Rate and Rhythm: Normal rate and regular rhythm.     Heart sounds: Normal heart sounds. No murmur heard.    No friction rub. No gallop.  Pulmonary:     Effort: Pulmonary effort is normal. No respiratory distress.     Breath sounds: Normal breath sounds. No wheezing or rales.  Abdominal:     General: Bowel sounds are normal. There is no distension.     Palpations: Abdomen is soft. There is no mass.     Tenderness:  There is no abdominal tenderness.  Musculoskeletal:        General: No tenderness. Normal range of motion.     Cervical back: Normal range of motion and neck supple.  Lymphadenopathy:     Cervical: Cervical adenopathy present.  Skin:    General: Skin is warm and dry.     Findings: No erythema or rash.  Neurological:     Mental Status: He is alert.     Coordination: Coordination normal.  Psychiatric:        Behavior: Behavior normal.     ED Results / Procedures / Treatments   Labs (all labs ordered are listed, but only abnormal results are displayed) Labs Reviewed  MONONUCLEOSIS SCREEN - Abnormal; Notable for the following components:      Result Value   Mono Screen POSITIVE (*)    All other components within normal limits  CBC WITH DIFFERENTIAL/PLATELET - Abnormal; Notable for the following components:   WBC 13.6 (*)    Lymphs Abs 7.9 (*)    Monocytes Absolute 1.1 (*)    All other components within normal limits  BASIC METABOLIC PANEL WITH GFR - Abnormal; Notable for the following components:   Glucose, Bld 110 (*)    All other components within normal limits    EKG None  Radiology CT Soft Tissue Neck W Contrast Result Date: 02/17/2024 CLINICAL DATA:  Soft tissue infection suspected, neck, xray done EXAM: CT NECK WITH CONTRAST TECHNIQUE: Multidetector CT imaging of the neck was performed using the standard protocol following the bolus administration of intravenous contrast. RADIATION DOSE REDUCTION: This exam was performed according to the departmental dose-optimization program which includes automated exposure control, adjustment of the mA and/or kV according to patient size and/or use of iterative reconstruction technique. CONTRAST:  75mL OMNIPAQUE IOHEXOL 300 MG/ML  SOLN COMPARISON:  None Available. FINDINGS: Pharynx and larynx: Enlarged and edematous tonsils, compatible with tonsillitis. The tonsils are heterogeneous with multiple areas of low density. Salivary glands: No  inflammation, mass, or stone. Thyroid: Normal. Lymph nodes: Prominent number of lymph nodes throughout the neck bilaterally, most likely reactive given the above findings Vascular: Not well assessed due to non arterial timing. Limited intracranial: Negative. Visualized orbits: Negative. Mastoids and visualized paranasal sinuses: Opacified left sphenoid sinus. No mastoid effusions. Skeleton: No acute fracture on limited assessment. Upper chest: Visualized lung apices are clear. IMPRESSION: Findings compatible with acute tonsillitis. The tonsils are heterogeneous with multiple areas of low density, compatible with phlegmonous change. Electronically Signed   By: Macarthur Savory  Dyanna Glasgow M.D.   On: 02/17/2024 22:30    Procedures Procedures    Medications Ordered in ED Medications  dexamethasone (DECADRON) injection 10 mg (10 mg Intravenous Given 02/17/24 2109)  clindamycin (CLEOCIN) IVPB 300 mg (0 mg Intravenous Stopped 02/17/24 2244)  iohexol (OMNIPAQUE) 300 MG/ML solution 75 mL (75 mLs Intravenous Contrast Given 02/17/24 2126)    ED Course/ Medical Decision Making/ A&P                                 Medical Decision Making Amount and/or Complexity of Data Reviewed Labs: ordered. Radiology: ordered.  Risk Prescription drug management.    This patient presents to the ED for concern of bilateral pharyngitis worsening over several days differential diagnosis includes peritonsillar abscess, this could be a viral process such as mononucleosis, he does not appear to have asymmetry to suggest abscess but CT scan will be ordered due to the changes in voice.  Will also give Decadron and clindamycin while awaiting labs and CT scan    Additional history obtained:  Additional history obtained from medical record External records from outside source obtained and reviewed including prior office visit from last week testing positive for strep   Lab Tests:  I Ordered, and personally interpreted labs.  The  pertinent results include: Leukocytosis with a lymphocyte predominance, there are greater than 10% reactive benign lymphocytes.  No increase in neutrophils, monoscreen positive, metabolic panel negative   Imaging Studies ordered:  I ordered imaging studies including CT scan of the neck with contrast I independently visualized and interpreted imaging which showed tonsillitis but no signs of abscess I agree with the radiologist interpretation   Medicines ordered and prescription drug management:  I ordered medication including Decadron and initially given clindamycin out of concern for infectious tonsillitis, for pain Reevaluation of the patient after these medicines showed that the patient improved I have reviewed the patients home medicines and have made adjustments as needed   Problem List / ED Course:  The patient had no further coughing up or production of blood while in the emergency department, vital signs remained normal without fever or tachycardia.  He was updated on all of his results and both he and the family member at the bedside are agreeable to the plan for d/c, medrol, lidocaine , naprosyn and close f/u.          Final Clinical Impression(s) / ED Diagnoses Final diagnoses:  Acute tonsillitis due to infectious mononucleosis    Rx / DC Orders ED Discharge Orders          Ordered    methylPREDNISolone (MEDROL DOSEPAK) 4 MG TBPK tablet        02/17/24 2241    lidocaine  (XYLOCAINE ) 2 % solution  Every 4 hours PRN        02/17/24 2241    naproxen (NAPROSYN) 500 MG tablet  2 times daily with meals        02/17/24 2241              Early Glisson, MD 02/17/24 2244
